# Patient Record
Sex: Male | Born: 1995 | Race: White | Hispanic: No | Marital: Single
Health system: Midwestern US, Community
[De-identification: ages and names within clinical notes are randomized; demographics above are authoritative.]

## PROBLEM LIST (undated history)

## (undated) DIAGNOSIS — F319 Bipolar disorder, unspecified: Secondary | ICD-10-CM

## (undated) DIAGNOSIS — F419 Anxiety disorder, unspecified: Secondary | ICD-10-CM

---

## 2015-09-28 ENCOUNTER — Encounter (HOSPITAL_BASED_OUTPATIENT_CLINIC_OR_DEPARTMENT_OTHER): Payer: Self-pay

## 2015-09-28 ENCOUNTER — Emergency Department (HOSPITAL_BASED_OUTPATIENT_CLINIC_OR_DEPARTMENT_OTHER)
Admission: EM | Admit: 2015-09-28 | Discharge: 2015-09-29 | Disposition: A | Discharge status: Other Acute Inpatient Hospital | Payer: Self-pay | Attending: Emergency Medicine | Admitting: Emergency Medicine

## 2015-09-28 DIAGNOSIS — F29 Unspecified psychosis not due to a substance or known physiological condition: Secondary | ICD-10-CM | POA: Insufficient documentation

## 2015-09-28 DIAGNOSIS — F1220 Cannabis dependence, uncomplicated: Secondary | ICD-10-CM | POA: Diagnosis present

## 2015-09-28 HISTORY — DX: Bipolar disorder, unspecified: F31.9

## 2015-09-28 HISTORY — DX: Anxiety disorder, unspecified: F41.9

## 2015-09-28 LAB — RAPID URINE DRUG SCREEN, HOSP PERFORMED
Amphetamines: NOT DETECTED
Barbiturates: NOT DETECTED
Benzodiazepines: NOT DETECTED
Cocaine: NOT DETECTED
Opiates: NOT DETECTED
Tetrahydrocannabinol: POSITIVE — AB

## 2015-09-28 LAB — CBC WITH DIFFERENTIAL/PLATELET
BASOS ABS: 0 10*3/uL (ref 0.0–0.1)
Basophils Relative: 0 %
EOS ABS: 0.2 10*3/uL (ref 0.0–0.7)
EOS PCT: 2 %
HCT: 46.6 % (ref 39.0–52.0)
Hemoglobin: 16.3 g/dL (ref 13.0–17.0)
LYMPHS ABS: 2.1 10*3/uL (ref 0.7–4.0)
LYMPHS PCT: 19 %
MCH: 30.5 pg (ref 26.0–34.0)
MCHC: 35 g/dL (ref 30.0–36.0)
MCV: 87.3 fL (ref 78.0–100.0)
MONO ABS: 0.9 10*3/uL (ref 0.1–1.0)
Monocytes Relative: 8 %
Neutro Abs: 7.8 10*3/uL — ABNORMAL HIGH (ref 1.7–7.7)
Neutrophils Relative %: 71 %
PLATELETS: 273 10*3/uL (ref 150–400)
RBC: 5.34 MIL/uL (ref 4.22–5.81)
RDW: 12.8 % (ref 11.5–15.5)
WBC: 11 10*3/uL — AB (ref 4.0–10.5)

## 2015-09-28 LAB — ACETAMINOPHEN LEVEL

## 2015-09-28 LAB — COMPREHENSIVE METABOLIC PANEL
ALT: 76 U/L — AB (ref 17–63)
AST: 41 U/L (ref 15–41)
Albumin: 4.6 g/dL (ref 3.5–5.0)
Alkaline Phosphatase: 68 U/L (ref 38–126)
Anion gap: 9 (ref 5–15)
BUN: 12 mg/dL (ref 6–20)
CHLORIDE: 104 mmol/L (ref 101–111)
CO2: 24 mmol/L (ref 22–32)
Calcium: 9.1 mg/dL (ref 8.9–10.3)
Creatinine, Ser: 0.8 mg/dL (ref 0.61–1.24)
Glucose, Bld: 91 mg/dL (ref 65–99)
POTASSIUM: 3.8 mmol/L (ref 3.5–5.1)
SODIUM: 137 mmol/L (ref 135–145)
Total Bilirubin: 0.5 mg/dL (ref 0.3–1.2)
Total Protein: 7.9 g/dL (ref 6.5–8.1)

## 2015-09-28 LAB — URINALYSIS, ROUTINE W REFLEX MICROSCOPIC
Bilirubin Urine: NEGATIVE
GLUCOSE, UA: NEGATIVE mg/dL
Hgb urine dipstick: NEGATIVE
KETONES UR: NEGATIVE mg/dL
LEUKOCYTES UA: NEGATIVE
NITRITE: NEGATIVE
PH: 7 (ref 5.0–8.0)
Protein, ur: NEGATIVE mg/dL
Specific Gravity, Urine: 1.024 (ref 1.005–1.030)

## 2015-09-28 LAB — SALICYLATE LEVEL

## 2015-09-28 LAB — ETHANOL

## 2015-09-28 MED ORDER — LORAZEPAM 1 MG PO TABS
1.0000 mg | ORAL_TABLET | Freq: Three times a day (TID) | ORAL | Status: DC | PRN
  Administered 2015-09-29 (×2): 1 mg via ORAL
  Filled 2015-09-28: qty 1

## 2015-09-28 MED ORDER — ZIPRASIDONE MESYLATE 20 MG IM SOLR
20.0000 mg | Freq: Once | INTRAMUSCULAR | Status: AC | PRN
  Administered 2015-09-28: 20 mg via INTRAMUSCULAR
  Filled 2015-09-28: qty 20

## 2015-09-28 MED ORDER — LORAZEPAM 1 MG PO TABS
2.0000 mg | ORAL_TABLET | Freq: Once | ORAL | Status: AC
  Administered 2015-09-28: 2 mg via ORAL

## 2015-09-28 MED ORDER — ONDANSETRON HCL 4 MG PO TABS: 4.0000 mg | ORAL_TABLET | Freq: Three times a day (TID) | ORAL | Status: DC | PRN

## 2015-09-28 MED ORDER — LORAZEPAM 1 MG PO TABS
1.0000 mg | ORAL_TABLET | Freq: Once | ORAL | Status: DC
  Filled 2015-09-28: qty 1

## 2015-09-28 MED ORDER — DIPHENHYDRAMINE HCL 50 MG/ML IJ SOLN
50.0000 mg | Freq: Once | INTRAMUSCULAR | Status: AC
  Administered 2015-09-28: 50 mg via INTRAMUSCULAR
  Filled 2015-09-28: qty 1

## 2015-09-28 MED ORDER — ZIPRASIDONE MESYLATE 20 MG IM SOLR: 20.0000 mg | Freq: Four times a day (QID) | INTRAMUSCULAR | Status: DC | PRN

## 2015-09-28 MED ORDER — LORAZEPAM 2 MG/ML IJ SOLN
2.0000 mg | Freq: Once | INTRAMUSCULAR | Status: AC
  Administered 2015-09-28: 2 mg via INTRAMUSCULAR
  Filled 2015-09-28: qty 1

## 2015-09-28 MED ORDER — STERILE WATER FOR INJECTION IJ SOLN
INTRAMUSCULAR | Status: AC
  Administered 2015-09-28: 1.2 mL
  Filled 2015-09-28: qty 10

## 2015-09-28 MED ORDER — IBUPROFEN 200 MG PO TABS: 600.0000 mg | ORAL_TABLET | Freq: Three times a day (TID) | ORAL | Status: DC | PRN

## 2015-09-28 MED ORDER — LORAZEPAM 1 MG PO TABS
ORAL_TABLET | ORAL | Status: AC
  Filled 2015-09-28: qty 2

## 2015-09-28 MED ORDER — ACETAMINOPHEN 325 MG PO TABS: 650.0000 mg | ORAL_TABLET | ORAL | Status: DC | PRN

## 2015-09-28 NOTE — ED Notes (Signed)
Mother in waiting room report pt is sending messages thru facebook to her , HPPD made aware and cell phone taken and given to mother

## 2015-09-28 NOTE — ED Notes (Signed)
Pa  at bedside. 

## 2015-09-28 NOTE — ED Notes (Addendum)
Writer explain to patient that Anthony Sievert, PA had order medication to help decrease his agitation and anxiety. Patient yelled "Trump says I don't have to take that shit". "I don't take government medication but if you give me weed or marijuana I'll take that".  Patient given injection with moderate resistance. Encouragement and support provided and  Safety maintain. Q 15 min safety checks remain in place.

## 2015-09-28 NOTE — ED Notes (Addendum)
Pt states "i can read anybody and i just know things but they think i'm crazy"-pt with fast talking rambling speech-states mother brought him in due to argument with mother and dad-pt was brought in by mother who is in ED WR

## 2015-09-28 NOTE — ED Notes (Signed)
HPPD and security at bedside , IVC papers completed

## 2015-09-28 NOTE — ED Notes (Signed)
Patient laying down on stretcher. Respirations equal and unlabored, skin warm and dry. NAD noted. Encouragement and support provided and safety maintain. Q 15 min safety checks remain in place.

## 2015-09-28 NOTE — ED Notes (Signed)
Donell Sievert, PA contacted and informed of patient behavior and new orders received Ativan 2 mg IM and benadryl 50 mg IM orders read back and verified. Q 15 min safety checks remain in place.

## 2015-09-28 NOTE — ED Notes (Signed)
Writer went in to introduce self to patient. Patient started stating "I'm not staying her nigger bitch". Writer tried to explain discharge procedure to patient. He stated " I'm kicked this damn door down to get out". "You can't hold me ". Writer once again tried to explain to patient about involuntary procedure and patient states "fuck that I ain't sign shit". "You holding me here illegally". Patient agitation increased. Patient pacing yelling, and punching walls. Encouragement and support provided and safety maintain. Q 15 min safety checks in place.
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
## 2015-09-28 NOTE — Progress Notes (Signed)
Referral has been sent to: [facility] - per Dondra Spry, send referral. [facility] - per Arlys John, fax over referral. [facility] - per intake, fax referral for the waitlist.  [facility] - referral can be faxed with IVC papers. [facility] - per [place], will look at referrals for tomorrow.  At capacity: [facility] - per Jeanie Cooks - per Charlott Rakes - per Olga Millers, but call back Darlene at 8:30am   CSW will continue to seek placement.  Melbourne Abts, LCSWA Disposition staff 09/28/2015 6:08 PM

## 2015-09-28 NOTE — ED Provider Notes (Signed)
MSE was initiated and I personally evaluated the patient and placed orders (if any) at  9:09 PM on September 28, 2015.  Plan she'll male with psychosis accepted in transfer from [facility]. Appears stable for psychiatric placement.  The patient appears stable so that the remainder of the MSE may be completed by another provider.   Lyndal Pulley, MD 09/28/15 2109

## 2015-09-28 NOTE — ED Notes (Signed)
TTS eval with pt now
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
## 2015-09-28 NOTE — ED Notes (Signed)
Bed: WUJ81 Expected date: 09/28/15 Expected time:  Means of arrival:  Comments: Holding for [facility] pt

## 2015-09-28 NOTE — ED Notes (Signed)
Patient ambulatory with a steady gait to treatment area 42 escorted by 2 [employer] officers and [place], [place].

## 2015-09-28 NOTE — ED Notes (Signed)
IVC papers faxed to [facility]

## 2015-09-28 NOTE — ED Notes (Signed)
MD at bedside. 

## 2015-09-28 NOTE — ED Notes (Signed)
Johna Roles RN [facility] charge nurse accepted Pt [facility] ED , HPPD made aware. Mother notified. Report called to [facility] RN [phone]

## 2015-09-28 NOTE — ED Notes (Addendum)
Tyler Aas ( mother) phone number [phone]

## 2015-09-29 ENCOUNTER — Inpatient Hospital Stay
Admission: EM | Admit: 2015-09-29 | Discharge: 2015-10-04 | DRG: 885 | Disposition: A | Discharge status: Home / Self Care | Payer: No Typology Code available for payment source | Source: Intra-hospital | Attending: Psychiatry | Admitting: Psychiatry

## 2015-09-29 ENCOUNTER — Encounter: Payer: Self-pay | Admitting: Psychiatry

## 2015-09-29 DIAGNOSIS — F4010 Social phobia, unspecified: Secondary | ICD-10-CM | POA: Diagnosis present

## 2015-09-29 DIAGNOSIS — F17200 Nicotine dependence, unspecified, uncomplicated: Secondary | ICD-10-CM | POA: Diagnosis present

## 2015-09-29 DIAGNOSIS — F312 Bipolar disorder, current episode manic severe with psychotic features: Secondary | ICD-10-CM

## 2015-09-29 DIAGNOSIS — E8881 Metabolic syndrome: Secondary | ICD-10-CM | POA: Diagnosis present

## 2015-09-29 DIAGNOSIS — F410 Panic disorder [episodic paroxysmal anxiety] without agoraphobia: Secondary | ICD-10-CM | POA: Diagnosis present

## 2015-09-29 DIAGNOSIS — F1220 Cannabis dependence, uncomplicated: Secondary | ICD-10-CM | POA: Diagnosis present

## 2015-09-29 DIAGNOSIS — R45850 Homicidal ideations: Secondary | ICD-10-CM | POA: Diagnosis present

## 2015-09-29 DIAGNOSIS — F29 Unspecified psychosis not due to a substance or known physiological condition: Secondary | ICD-10-CM | POA: Insufficient documentation

## 2015-09-29 DIAGNOSIS — G4700 Insomnia, unspecified: Secondary | ICD-10-CM | POA: Diagnosis present

## 2015-09-29 DIAGNOSIS — Z818 Family history of other mental and behavioral disorders: Secondary | ICD-10-CM | POA: Diagnosis not present

## 2015-09-29 DIAGNOSIS — F429 Obsessive-compulsive disorder, unspecified: Secondary | ICD-10-CM | POA: Diagnosis present

## 2015-09-29 MED ORDER — NICOTINE 21 MG/24HR TD PT24
21.0000 mg | MEDICATED_PATCH | Freq: Once | TRANSDERMAL | Status: DC
  Administered 2015-09-29: 21 mg via TRANSDERMAL
  Filled 2015-09-29: qty 1

## 2015-09-29 MED ORDER — MAGNESIUM HYDROXIDE 400 MG/5ML PO SUSP: 30.0000 mL | Freq: Every day | ORAL | Status: DC | PRN

## 2015-09-29 MED ORDER — ALUM & MAG HYDROXIDE-SIMETH 200-200-20 MG/5ML PO SUSP
30.0000 mL | ORAL | Status: DC | PRN
  Administered 2015-09-30: 30 mL via ORAL
  Filled 2015-09-29: qty 30

## 2015-09-29 MED ORDER — OLANZAPINE 5 MG PO TBDP
15.0000 mg | ORAL_TABLET | Freq: Every day | ORAL | Status: DC
  Administered 2015-09-29 – 2015-10-03 (×5): 15 mg via ORAL
  Filled 2015-09-29 (×6): qty 3

## 2015-09-29 MED ORDER — OXCARBAZEPINE 300 MG PO TABS
300.0000 mg | ORAL_TABLET | Freq: Two times a day (BID) | ORAL | Status: DC
  Administered 2015-09-29: 300 mg via ORAL
  Filled 2015-09-29: qty 1

## 2015-09-29 MED ORDER — ACETAMINOPHEN 325 MG PO TABS
650.0000 mg | ORAL_TABLET | Freq: Four times a day (QID) | ORAL | Status: DC | PRN
  Administered 2015-10-02 – 2015-10-04 (×2): 650 mg via ORAL
  Filled 2015-09-29 (×2): qty 2

## 2015-09-29 MED ORDER — LORAZEPAM 2 MG PO TABS
2.0000 mg | ORAL_TABLET | ORAL | Status: DC | PRN
  Administered 2015-09-30 – 2015-10-01 (×3): 2 mg via ORAL
  Filled 2015-09-29 (×3): qty 1

## 2015-09-29 MED ORDER — TRAZODONE HCL 50 MG PO TABS
150.0000 mg | ORAL_TABLET | Freq: Every evening | ORAL | Status: DC | PRN
  Administered 2015-10-01 – 2015-10-03 (×3): 150 mg via ORAL
  Filled 2015-09-29 (×3): qty 1

## 2015-09-29 MED ORDER — OXCARBAZEPINE 300 MG PO TABS
300.0000 mg | ORAL_TABLET | Freq: Two times a day (BID) | ORAL | Status: DC
  Administered 2015-09-29 – 2015-09-30 (×2): 300 mg via ORAL
  Filled 2015-09-29 (×4): qty 1

## 2015-09-29 MED ORDER — HALOPERIDOL 1 MG PO TABS
0.5000 mg | ORAL_TABLET | Freq: Two times a day (BID) | ORAL | Status: DC
  Administered 2015-09-29: 0.5 mg via ORAL
  Filled 2015-09-29: qty 1

## 2015-09-29 MED ORDER — NICOTINE 21 MG/24HR TD PT24
21.0000 mg | MEDICATED_PATCH | Freq: Every day | TRANSDERMAL | Status: DC
  Administered 2015-09-30 – 2015-10-04 (×5): 21 mg via TRANSDERMAL
  Filled 2015-09-29 (×5): qty 1

## 2015-09-29 NOTE — BH Assessment (Signed)
Patient has been accepted to [facility].  Accepting physician is Dr. Jennet Maduro.  Attending Physician will be Dr. Jennet Maduro.  Patient has been assigned to room 322-A, by [facility] Charge Nurse Victorino Dike.  Call report to [phone].  Representative/Transfer Coordinator is Cindy Brindisi.  [facility] ER Staff Jessie Foot, TTS) made aware of acceptance.
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
## 2015-09-29 NOTE — ED Notes (Signed)
Patient slept soundly during shift. Respirations equal and unlabored, skin warm and dry, and NAD. Safety maintain and Q 15 min safety checks remain in place.

## 2015-09-29 NOTE — ED Notes (Signed)
Patient transported to [facility], by [place] [facility] [employer], for continuation of specialized care. Belongings signed for and given to deputy. Pt left in no acute distress. 

## 2015-09-29 NOTE — ED Notes (Addendum)
Pt has no insight in regards to his behavior. Pt blaming others for being in the hospital. Pt denies SI/HI. Delusional. 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 Pt presents with paranoia about government. Remains on special checks q 15 mins for safety. Will continue to monitor.

## 2015-09-29 NOTE — ED Notes (Signed)
Patient calm and cooperative at this time. Encouragement and support provided and safety maintain. Q 15 min safety checks in place.

## 2015-09-29 NOTE — ED Notes (Signed)
Report called to Victorino Dike, Charity fundraiser at [facility]. Sheriff called for transport. Will continue to monitor.

## 2015-09-30 DIAGNOSIS — F312 Bipolar disorder, current episode manic severe with psychotic features: Principal | ICD-10-CM

## 2015-09-30 LAB — LIPID PANEL
Cholesterol: 114 mg/dL (ref 0–200)
HDL: 37 mg/dL — ABNORMAL LOW (ref >40)
LDL CALC: 66 mg/dL (ref 0–99)
Total CHOL/HDL Ratio: 3.1 RATIO
Triglycerides: 53 mg/dL (ref <150)
VLDL: 11 mg/dL (ref 0–40)

## 2015-09-30 LAB — TSH: TSH: 2.339 u[IU]/mL (ref 0.350–4.500)

## 2015-09-30 LAB — HEMOGLOBIN A1C: HEMOGLOBIN A1C: 5 % (ref 4.0–6.0)
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
## 2015-09-30 NOTE — [facility] Group Notes (Signed)
[facility] LCSW Group Therapy  09/30/2015 3:15 PM  Type of Therapy:  Group Therapy  Participation Level:  Did Not Attend  Modes of Intervention:  Discussion, Education, Socialization and Support  Summary of Progress/Problems: Balance in life: Patients will discuss the concept of balance and how it looks and feels to be unbalanced. Pt will identify areas in their life that is unbalanced and ways to become more balanced.    Tenasia Aull L Jemimah Cressy MSW, LCSWA  09/30/2015, 3:15 PM   
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
## 2015-09-30 NOTE — Tx Team (Signed)
Initial Interdisciplinary Treatment Plan   PATIENT STRESSORS: Marital or family conflict Substance abuse   PATIENT STRENGTHS: Ability for insight Active sense of humor Communication skills Motivation for treatment/growth   PROBLEM LIST: Problem List/Patient Goals Date to be addressed Date deferred Reason deferred Estimated date of resolution  Anger & Aggression 09/29/2015     Homicidal Ideation 09/29/2015     Substance Abuse --- MJ 09/29/2015     Psychosis/Delusion 09/29/2015                                    DISCHARGE CRITERIA:  Ability to meet basic life and health needs Motivation to continue treatment in a less acute level of care Reduction of life-threatening or endangering symptoms to within safe limits Safe-care adequate arrangements made Verbal commitment to aftercare and medication compliance  PRELIMINARY DISCHARGE PLAN: Attend aftercare/continuing care group Participate in family therapy Return to previous living arrangement  PATIENT/FAMIILY INVOLVEMENT: This treatment plan has been presented to and reviewed with the patient, Anthony Koch, and/or family member  The patient and family have been given the opportunity to ask questions and make suggestions.  Inetta Dicke T Robyn Galati 09/30/2015, 5:20 AM

## 2015-09-30 NOTE — Progress Notes (Signed)
Arrived to unit @ 2100 in the custody of Law Enforcement accompanied by hospital nursing staffs, Anthony Koch is a 20yo WM admitted from [facility] for Psychosis, Delusions, h/o Bipolar d/o, manic, IVC by parents. Patient searched by 2 nursing staffs on admission for contrabands to ensure a safe and therapeutic milieu for all, no paraphilia or any other contrabands found on patient or in the belongings. Skin Assessment completed and documented, patient's back significantly observed with "red angry looking rashes, they are acnes, I got them from [place] when I was incarcerated for drug trafficking, I don't plan to kill myself because I have a 79 year old son .Marland Kitchen.."  Bright, approachable, mood and affect upbeat, readily adjusted to the new environment.

## 2015-09-30 NOTE — BHH Group Notes (Signed)
BHH Group Notes:  (Nursing/MHT/Case Management/Adjunct)  Date:  09/30/2015  Time:  3:18 PM  Type of Therapy:  Psychoeducational Skills  Participation Level:  Did Not Attend  Anthony Koch 09/30/2015, 3:18 PM 

## 2015-09-30 NOTE — Progress Notes (Signed)
Recreation Therapy Notes  INPATIENT RECREATION THERAPY ASSESSMENT  Patient Details Name: Anthony Koch MRN: [id number] DOB: [birth year] Today's Date: 09/30/2015  Patient Stressors: Family, Relationship, Other (Comment) (Constant nagging from father and brother, feels like mother loves other kids more than pt; dad has lung cancer and COPD, mom has MS and heart problems; the way his family has become is stressful; recent break-up with girlfriend of 4 years, about to be )engaged; son lives with the mom in [place] - pt cannot see son until he can pass a drug test.  Coping Skills:   Isolate, Substance Abuse, Avoidance, Exercise, Music, Sports, Other (Comment) (Cry)  Personal Challenges: Anger, Communication, Concentration, Expressing Yourself, Relationships, Self-Esteem/Confidence, Social Interaction, Stress Management, Time Management, Trusting Others  Leisure Interests (2+):  Individual - Other (Comment) (Music and cars)  Awareness of Community Resources:  No  Community Resources:     Current Use:    If no, Barriers?:    Patient Strengths:  [employer], intelligent  Patient Identified Areas of Improvement:  Anxiety, anger  Current Recreation Participation:  Playing video games, selling drugs  Patient Goal for Hospitalization:  To get out of here  [place] of Residence:  [place] of Residence:  [place]   Current [place] (including self-harm):  No  Current HI:  No  Consent to Intern Participation: N/A   Jacquelynn Cree, LRT/CTRS 09/30/2015, 11:28 AM

## 2015-09-30 NOTE — Plan of Care (Signed)
Problem: Alteration in mood & ability to function due to Goal: LTG-Pt reports reduction in suicidal thoughts (Patient reports reduction in suicidal thoughts and is able to verbalize a safety plan for whenever patient is feeling suicidal)  Outcome: Progressing Denies SI     

## 2015-09-30 NOTE — BHH Group Notes (Signed)
BHH Group Notes:  (Nursing/MHT/Case Management/Adjunct)  Date:  09/30/2015  Time:  8:57 AM  Type of Therapy:  Community Meeting   Participation Level:  Did Not Attend:  Pier Bosher De'Chelle Shontavia Mickel 09/30/2015, 8:57 AM

## 2015-09-30 NOTE — Progress Notes (Signed)
Recreation Therapy Notes  Date: 01.26.17 Time: 3:00 pm Location: Craft Room  Group Topic: Leisure Education  Goal Area(s) Addresses:  Patient will identify activities for each letter of the alphabet. Patient will verbalize ability to integrate positive leisure into life post d/c. Patient will verbalize ability to use leisure as a Associate Professor.  Behavioral Response: Did not attend  Intervention: Leisure Alphabet  Activity: Patients were given a Leisure Information systems manager and instructed to list healthy leisure activities for each letter of the alphabet bet.  Education: LRT educated patients on what they need to participate in leisure.  Education Outcome: Patient did not attend group.  Clinical Observations/Feedback: Patient did not attend group.  Jacquelynn Cree, LRT/CTRS 09/30/2015 4:21 PM

## 2015-10-01 LAB — PROLACTIN: Prolactin: 20 ng/mL — ABNORMAL HIGH (ref 4.0–15.2)

## 2015-10-01 MED ORDER — CLONIDINE HCL 0.1 MG PO TABS
ORAL_TABLET | ORAL | Status: AC
  Administered 2015-10-01: 11:00:00
  Filled 2015-10-01: qty 2

## 2015-10-01 MED ORDER — FLUOXETINE HCL 20 MG PO CAPS
20.0000 mg | ORAL_CAPSULE | Freq: Every day | ORAL | Status: DC
  Administered 2015-10-01 – 2015-10-04 (×4): 20 mg via ORAL
  Filled 2015-10-01 (×4): qty 1

## 2015-10-01 MED ORDER — CLONIDINE HCL 0.1 MG PO TABS
0.2000 mg | ORAL_TABLET | ORAL | Status: AC
  Administered 2015-10-01: 0.2 mg via ORAL
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
## 2015-10-01 NOTE — Progress Notes (Signed)
D: Pt denies SI/HI/AVH. Pt is pleasant and cooperative. Pt stated he feels tired and he thinks it might be some side effect of medication, VS WNL no acute distress noted. Patient  appears anxious. Patient isolates to his room not interacting with peers.  A: Pt was offered support and encouragement.   Pt was encouraged to speak with the MD during the morning check up. Pt was given scheduled medications. Pt was encouraged to attend groups. Q 15 minute checks were done for safety.  R:Pt attends groups and interacts well with peers and staff. Pt is taking medication. Pt receptive to treatment and safety maintained on unit.
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
## 2015-10-01 NOTE — Plan of Care (Signed)
Problem: [facility] Participation in Recreation Therapeutic Interventions Goal: STG-Patient will identify at least five coping skills for ** STG: Coping Skills - Within 4 treatment sessions, patient will verbalize at least 5 coping skills for anger in each of 2 treatment sessions to increase anger management skills post d/c.  Outcome: Progressing Treatment Session 1; Completed 1 out of 2: At approximately 2:20 pm, LRT met with patient in consultation room. Patient verbalized 5 coping skills for anger. Patient verbalized what triggers him to get angry, how his body responds to anger, and how he is going to remember to use his healthy coping skills. LRT provided suggestions as well. Intervention Used: Coping Skills worksheet  Leonette Monarch, LRT/CTRS 01.27.17 2:38 pm Goal: STG-Other Recreation Therapy Goal (Specify) STG: Stress Management - Within 4 treatment sessions, patient will verbalize understanding of the stress management techniques in each of 2 treatment sessions to increase stress management skills post d/c.  Outcome: Progressing Treatment Session 1; Completed 1 out of 2: At approximately 2:20 pm, LRT met with patient in consultation room. LRT educated and provided patient with handouts on stress management techniques. Patient verbalized understanding. LRT encouraged patient to read over and practice the stress management techniques. Intervention Used: Stress Management handouts  Leonette Monarch, LRT/CTRS 01.27.17 2:40 pm
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
## 2015-10-01 NOTE — Progress Notes (Signed)
Recreation Therapy Notes  Date: 01.27.17 Time: 1:00 pm Location: Craft Room  Group Topic: Communication, Problem solving, and Teamwork  Goal Area(s) Addresses:  Patient will work in teams towards shared goal. Patient will verbalize skills needed to make activity successful. Patient will verbalize benefit of using skills identified to reach post d/c. goals  Behavioral Response: Did not attend   Intervention: Landing Pad  Activity: Patients were given 15 straws and approximately 3 feet of tape to build a contraption to catch a golf ball that was dropped from about 4 feet.  Education: LRT educated patients on how to use these skills post d/c.  Education Outcome: Patient did not attend group.  Clinical Observations/Feedback: Patient did not attend group.  Jacquelynn Cree, LRT/CTRS 10/01/2015 3:10 PM

## 2015-10-01 NOTE — [facility] Suicide Risk Assessment (Signed)
[facility] INPATIENT:  Family/Significant Other Suicide Prevention Education  Suicide Prevention Education:  Patient Refusal for Family/Significant Other Suicide Prevention Education: The patient Anthony Koch has refused to provide written consent for family/significant other to be provided Family/Significant Other Suicide Prevention Education during admission and/or prior to discharge.  Physician notified.  Pt refused SPE from the CSW.  Dorothe Pea Birgit Nowling 10/01/2015, 3:17 PM

## 2015-10-01 NOTE — Progress Notes (Signed)
Patient presents to nurses station complaining of intense pain to RUQ of abdomen right below rib cage.  Tender to touch.  BP 142/125.  Pulse 92.  Dr. Jennet Maduro paged and returned call.  Orders received.

## 2015-10-01 NOTE — Progress Notes (Signed)
CSW made attempt to contact the pt's mother Anthony Koch at ph: [phone], but was unable to speak to her and the CSW left a phone message.

## 2015-10-01 NOTE — [facility] Group Notes (Signed)
[facility] LCSW Group Therapy  10/01/2015 1:57 PM  Type of Therapy:  Group Therapy  Participation Level:  Did Not Attend  Modes of Intervention:  Discussion, Education, Socialization and Support  Summary of Progress/Problems: Feelings around Relapse. Group members discussed the meaning of relapse and shared personal stories of relapse, how it affected them and others, and how they perceived themselves during this time. Group members were encouraged to identify triggers, warning signs and coping skills used when facing the possibility of relapse. Social supports were discussed and explored in detail.   [employer] MSW, LCSWA  10/01/2015, 1:57 PM

## 2015-10-01 NOTE — Plan of Care (Signed)
Problem: Alteration in thought process Goal: LTG-Patient behavior demonstrates decreased signs psychosis (Patient behavior demonstrates decreased signs of psychosis to the point the patient is safe to return home and continue treatment in an outpatient setting.)  Outcome: Progressing Decreased psychosis noted.      

## 2015-10-02 NOTE — Progress Notes (Signed)
D: Pt denies SI/HI/AVH. Pt is pleasant and cooperative, affect is flat and sad. Patient appears less anxious and he is interacting with peers and staff appropriately.  A: Pt was offered support and encouragement. Pt was given scheduled medications. Pt was encouraged to attend groups. Q 15 minute checks were done for safety.  R:Pt attends groups and interacts well with peers and staff. Pt is taking medication. Pt receptive to treatment and safety maintained on unit.
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
## 2015-10-02 NOTE — [facility] Group Notes (Signed)
[facility] Group Notes:  (Nursing/MHT/Case Management/Adjunct)  Date:  10/02/2015  Time:  12:15 PM  Type of Therapy:  Group Therapy  Participation Level:  Did Not Attend   Buel Molder De'Chelle Braelynn Benning 10/02/2015, 12:15 PM

## 2015-10-02 NOTE — Progress Notes (Signed)
D:  Patient is alert and oriented on the unit this shift.  Patient attended and actively participated in groups in the afternoon.  Patient denies suicidal ideation, homicidal ideation, auditory or visual hallucinations at the present time.   A:  Scheduled medications are administered to patient as per MD orders.  Emotional support and encouragement are provided.  Patient is maintained on q.15 minute safety checks.  Patient is informed to notify staff with questions or concerns. R:  No adverse medication reactions are noted.  Patient is cooperative with medication administration and treatment plan today.  Patient slept most of the morning.  Patient is receptive, calm and cooperative on the unit at this time.  Patient interacts well with others on the unit this shift.  Patient contracts for safety at this time.  Patient remains safe at this time.

## 2015-10-02 NOTE — Plan of Care (Signed)
Problem: Alteration in thought process Goal: LTG-Patient is able to perceive the environment accurately Outcome: Progressing Patient appears alert and oriented this shift
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
## 2015-10-02 NOTE — Plan of Care (Signed)
Problem: Alteration in thought process Goal: LTG-Patient behavior demonstrates decreased signs psychosis (Patient behavior demonstrates decreased signs of psychosis to the point the patient is safe to return home and continue treatment in an outpatient setting.)  Outcome: Progressing Patient denies SI/HI.      

## 2015-10-02 NOTE — Plan of Care (Signed)
Problem: Alteration in thought process Goal: STG-Patient is able to follow short directions Outcome: Progressing Patient is cooperative and following short directions this shift

## 2015-10-03 NOTE — Progress Notes (Signed)
Patient denies SI/HI/AVH. Patient states that he is here because he has issues with anger and his parents made him come. Patient states that he is "not crazy" and that being around "all these crazy people is going to make me crazy". Patient compliant with medications. States that the medication that he took the previous night "the dissolving one"  made him "cry and become emotional". Patient is hyper and fidgety. The patient states that he is always moving and feeling anxious. Q 15 min checks maintained.

## 2015-10-03 NOTE — Progress Notes (Signed)
Patient remains anxious, back and forth pacing hallways to his room and dayroom through out the evening. He came to medication room and was med compliant. He voiced that he missed his two year old child. He appears to be resting in bed quietly at this time.

## 2015-10-03 NOTE — Progress Notes (Signed)
D: Patient bright on approach. States he's here because he had some anger problems but doesn't need to be here anymore. Denies SI/HI/AVH. Denies pain. Visible in the milieu and attending groups.  A: No medication given. Encouragement provided.  R: Patient calm and cooperative. Safety maintained with 15 min checks.

## 2015-10-03 NOTE — [facility] Group Notes (Signed)
[facility] LCSW Group Therapy  10/03/2015 4:09 PM  Type of Therapy:  Group Therapy  Participation Level:  Active  Participation Quality:  Attentive  Affect:  Euphoric  Cognitive:  Lacking  Insight:  Limited  Engagement in Therapy:  Limited  Modes of Intervention:  Discussion, Education, Socialization and Support  Summary of Progress/Problems: Mindfulness: Patient discussed mindfulness and relaxing techniques and why they are beneficial. Pt discussed ways to incorporate mindfulness in their lives. Pt practiced a mindfulness techique and discussed how it made them feel. Anthony Koch attended group and stayed the entire time. He states all he needs to be relaxed is a hot shower and "some booty".   [employer] MSW, LCSWA  10/03/2015, 4:09 PM
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
## 2015-10-03 NOTE — Plan of Care (Signed)
Problem: Ineffective individual coping Goal: STG: Pt will be able to identify effective and ineffective STG: Pt will be able to identify effective and ineffective coping patterns  Outcome: Progressing Patient was anxious on shift and needed PRN medication to help him calm down, but he was able to recognize his anxiety and prevent other behavioral issues.

## 2015-10-04 MED ORDER — FLUOXETINE HCL 20 MG PO CAPS: 20.0000 mg | ORAL_CAPSULE | Freq: Every day | ORAL | Status: DC

## 2015-10-04 MED ORDER — TRAZODONE HCL 150 MG PO TABS: 150.0000 mg | ORAL_TABLET | Freq: Every evening | ORAL | Status: DC | PRN

## 2015-10-04 MED ORDER — OLANZAPINE 15 MG PO TBDP: 15.0000 mg | ORAL_TABLET | Freq: Every day | ORAL | Status: DC

## 2015-10-04 NOTE — Tx Team (Signed)
Interdisciplinary Treatment Plan Update (Adult)  Date:  10/04/2015 Time Reviewed:  12:55 PM  Progress in Treatment: Attending groups: Yes. Participating in groups:  Yes. Taking medication as prescribed:  Yes. Tolerating medication:  Yes. Family/Significant othe contact made:  Yes, individual(s) contacted:  patient's mother Patient understands diagnosis:  Yes. Discussing patient identified problems/goals with staff:  Yes. Medical problems stabilized or resolved:  Yes. Denies suicidal/homicidal ideation: Yes. Issues/concerns per patient self-inventory:  Yes. Other:  New problem(s) identified: No, Describe:  none reported  Discharge Plan or Barriers: Patient will stabilize on medication and discharge home with family. Patient will need outpatient mental health follow up in [facility] at discharge.   Reason for Continuation of Hospitalization: Mania Medication stabilization  Comments:  Estimated length of stay: 0 days will discharge today Monday 10/04/15  New goal(s):  Review of initial/current patient goals per problem list:   1.  Goal(s):participate in aftercare plan  Met:  Yes  Target date:at discharge  As evidenced [id number] will participate in aftercare plan AEB aftercare provider and housing plan identified at discharge  10/04/15: patient has aftercare provider and will discharge home with his mother 2.  Goal (s): decrease mania  Met:  Yes  Target date:at discharge  As evidenced by: patient demonstrates decreased symptoms of mania 10/04/15: patient symptoms of mania have decreased and has shown much improvement and stable for discharge per MD  Attendees: Physician:  Orson Slick, MD 1/30/201712:55 PM  Nursing:   Nicanor Bake, RN 1/30/201712:55 PM  Other:  Carmell Austria, [place] 1/30/201712:55 PM  Other:   1/30/201712:55 PM  Other:   1/30/201712:55 PM  Other:  1/30/201712:55 PM  Other:  1/30/201712:55 PM  Other:  1/30/201712:55 PM  Other:   1/30/201712:55 PM  Other:  1/30/201712:55 PM  Other:  1/30/201712:55 PM  Other:   1/30/201712:55 PM   Scribe for Treatment Team:   Keene Breath, MSW, [place] [phone]  10/04/2015, 12:55 PM

## 2015-10-04 NOTE — Plan of Care (Signed)
Problem: Aggression Towards others,Towards Self, and or Destruction Goal: STG-Patient will comply with prescribed medication regimen (Patient will comply with prescribed medication regimen)  Outcome: Progressing Patient is cooperative with medication administration this shift

## 2015-10-04 NOTE — [facility] Suicide Risk Assessment (Signed)
[facility] Discharge Suicide Risk Assessment   Principal Problem: Bipolar I disorder, most recent episode manic, severe with psychotic features [facility]) Discharge Diagnoses:  Patient Active Problem List   Diagnosis Date Noted  . Cannabis use disorder, severe, dependence (HCC) [F12.20] 09/29/2015  . Tobacco use disorder [F17.200] 09/29/2015  . Bipolar I disorder, most recent episode manic, severe with psychotic features (HCC) [F31.2] 09/29/2015  . Psychoses [F29]     Total Time spent with patient: 30 minutes  Musculoskeletal: Strength & Muscle Tone: within normal limits Gait & Station: normal Patient leans: N/A  Psychiatric Specialty Exam: Review of Systems  All other systems reviewed and are negative.   Blood pressure 113/65, pulse 46, temperature 98.6 F (37 C), temperature source Oral, resp. rate 20, height  (1.778 m), weight 81.647 kg (180 lb), SpO2 99 %.Body mass index is 25.83 kg/(m^2).  General Appearance: Casual  Eye Contact::  Good  Speech:  Clear and Coherent409  Volume:  Normal  Mood:  Euthymic  Affect:  Appropriate  Thought Process:  Goal Directed  Orientation:  Full (Time, Place, and Person)  Thought Content:  WDL  Suicidal Thoughts:  No  Homicidal Thoughts:  No  Memory:  Immediate;   Fair Recent;   Fair Remote;   Fair  Judgement:  Fair  Insight:  Fair  Psychomotor Activity:  Normal  Concentration:  Fair  Recall:  [employer] of Knowledge:Fair  Language: Fair  Akathisia:  No  Handed:  Left  AIMS (if indicated):     Assets:  Communication Skills Desire for Improvement Housing Physical Health Resilience Social Support  Sleep:  Number of Hours: 8.25  Cognition: WNL  ADL's:  Intact   Mental Status Per Nursing Assessment::   On Admission:     Demographic Factors:  Male, Adolescent or young adult, Caucasian and Unemployed  Loss Factors: Financial problems/change in socioeconomic status  Historical Factors: Impulsivity  Risk Reduction Factors:    Sense of responsibility to family, Living with another person, especially a relative and Positive social support  Continued Clinical Symptoms:  Bipolar Disorder:   Mixed State Alcohol/Substance Abuse/Dependencies  Cognitive Features That Contribute To Risk:  None    Suicide Risk:  Minimal: No identifiable suicidal ideation.  Patients presenting with no risk factors but with morbid ruminations; may be classified as minimal risk based on the severity of the depressive symptoms  Follow-up Information    Follow up with [facility].   Why:  Please arrive to the walk-in clinic between the hours of 9am-4pm for your re-entry assessment for medication managment, therapy and substance abuse treatment   Contact information:   [address]  [place], [place] [address] Phone: [phone] Fax: [phone]      Plan Of Care/Follow-up recommendations:  Activity:  as tolerated Diet:  regular Other:  keep follow up appointment  Kristine Linea, MD 10/04/2015, 9:22 AM

## 2015-10-04 NOTE — Plan of Care (Signed)
Problem: Alteration in thought process Goal: LTG-Patient has not harmed self or others in at least 2 days Outcome: Progressing Patient has displayed no self harm behaviors in at least 2 days

## 2015-10-04 NOTE — Plan of Care (Signed)
Problem: [facility] Participation in Recreation Therapeutic Interventions Goal: STG-Patient will identify at least five coping skills for ** STG: Coping Skills - Within 4 treatment sessions, patient will verbalize at least 5 coping skills for anger in each of 2 treatment sessions to increase anger management skills post d/c.  Outcome: Completed/Met Date Met:  10/04/15 Treatment Session 2; Completed 2 out of 2: At approximately 12:20 pm, LRT met with patient in patient room. Patient verbalized 5 coping skills for anger. LRT encouraged patient to use his coping skills when he felt himself getting angry to help calm himself down.  Intervention Used: Coping Skills worksheet  Leonette Monarch, LRT/CTRS 01.30.17 1:39 pm Goal: STG-Other Recreation Therapy Goal (Specify) STG: Stress Management - Within 4 treatment sessions, patient will verbalize understanding of the stress management techniques in each of 2 treatment sessions to increase stress management skills post d/c.  Outcome: Completed/Met Date Met:  10/04/15 Treatment Session 2; Completed 2 out of 2: At approximately 12:20 pm, LRT met with patient in patient room. Patient reported he read over and practiced the stress management techniques. Patient verbalized understanding and reported the techniques were helpful. LRT encouraged patient to continue practicing the stress management techniques. Intervention Used: Stress Management handouts  Leonette Monarch, LRT/CTRS 01.30.17 1:41 pm

## 2015-10-04 NOTE — Progress Notes (Signed)
Patient ID: Anthony Koch, male   DOB: March 28, 1996, 20 y.o.   MRN: [id number]  CSW attempted to contact the pt's mother and was unable to (this is the second attempt) reach er, CSw left a message.  CSW will continue to assess for appropriate contacts for the pt.

## 2015-10-04 NOTE — [facility] Suicide Risk Assessment (Addendum)
[facility] INPATIENT:  Family/Significant Other Suicide Prevention Education  Suicide Prevention Education:  Education Completed; Vonn Sliger (mother) [phone] has been identified by the patient as the family member/significant other with whom the patient will be residing, and identified as the person(s) who will aid the patient in the event of a mental health crisis (suicidal ideations/suicide attempt).  With written consent from the patient, the family member/significant other has been provided the following suicide prevention education, prior to the and/or following the discharge of the patient.  The suicide prevention education provided includes the following:  Suicide risk factors  Suicide prevention and interventions  National Suicide Hotline telephone number  [facility] assessment telephone number  [facility] Emergency Assistance 911  [facility] and/or Residential Mobile Crisis Unit telephone number  Request made of family/significant other to:  Remove weapons (e.g., guns, rifles, knives), all items previously/currently identified as safety concern.    Remove drugs/medications (over-the-counter, prescriptions, illicit drugs), all items previously/currently identified as a safety concern.  The family member/significant other verbalizes understanding of the suicide prevention education information provided.  The family member/significant other agrees to remove the items of safety concern listed above.  Lulu Riding, MSW, Theresia Majors [phone] 10/04/2015, 12:08 PM
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
## 2015-10-04 NOTE — Progress Notes (Signed)
D:  Patient expresses readiness for discharge today.  Patient denies any pain currently.  Patient denies suicidal ideation, homicidal ideation, auditory or visual hallucinations currently. A:  Medications and instructions for their use were reviewed with the patient and she voiced understanding.  Discharge instructions and follow up were reviewed with the patient.  Patient's belongings were returned upon her leaving the unit. R:  Patient signed for the return of her belongings.  Patient was cooperative with the discharge process.  Patient expressed understanding of discharge instructions, follow up, medications and their use.  Patient was escorted off the unit.  Patient remains safe at the time of discharge.     

## 2015-10-04 NOTE — Progress Notes (Addendum)
  [facility] Adult Case Management Discharge Plan :  Will you be returning to the same living situation after discharge:  Yes,  home with mom At discharge, do you have transportation home?: Yes,  mom will pick up Do you have the ability to pay for your medications: Yes,  patient pays for meds out of pocket and his parents also support patient in buying medications  Release of information consent forms completed and in the chart;  Patient's signature needed at discharge.  Patient to Follow up at: Follow-up Information    Follow up with [facility] Go on 10/06/2015.   Why:  For follow-up care appt on Wednesday 10/06/15 at 12:30pm   Contact information:   [address] [place], [place] [address] Ph [phone] Fax [phone]      Next level of care provider has access to [facility] Link:no  Safety Planning and Suicide Prevention discussed: Yes,  SPE discussed with patient and Clair Alfieri (mother) [phone]  Have you used any form of tobacco in the last 30 days? (Cigarettes, Smokeless Tobacco, Cigars, and/or Pipes): Yes  Has patient been referred to the Quitline?: Patient refused referral  Patient has been referred for addiction treatment: N/A  Lulu Riding, MSW, LCSWA [phone] 10/04/2015, 12:09 PM

## 2015-10-04 NOTE — Progress Notes (Signed)
Recreation Therapy Notes  INPATIENT RECREATION TR PLAN  Patient Details Name: Anthony Koch MRN: [id number] DOB: 1996/07/12 Today's Date: 10/04/2015  Rec Therapy Plan Is patient appropriate for Therapeutic Recreation?: Yes Treatment times per week: At least once a week TR Treatment/Interventions: 1:1 session, Group participation (Comment) (Appropriate participation in daily recreation therapy tx)  Discharge Criteria Pt will be discharged from therapy if:: Treatment goals are met, Discharged Treatment plan/goals/alternatives discussed and agreed upon by:: Patient/family  Discharge Summary Short term goals set: See Care Plan Short term goals met: Complete Progress toward goals comments: One-to-one attended One-to-one attended: Stress Management, Anger management Reason goals not met: N/A Therapeutic equipment acquired: None Reason patient discharged from therapy: Discharge from hospital Pt/family agrees with progress & goals achieved: Yes Date patient discharged from therapy: 10/04/15   Leonette Monarch, LRT/CTRS 10/04/2015, 4:58 PM

## 2015-12-05 ENCOUNTER — Emergency Department (HOSPITAL_COMMUNITY): Payer: Self-pay

## 2015-12-05 ENCOUNTER — Emergency Department (HOSPITAL_COMMUNITY)
Admission: EM | Admit: 2015-12-05 | Discharge: 2015-12-06 | Disposition: A | Discharge status: Home / Self Care | Payer: Self-pay | Attending: Emergency Medicine | Admitting: Emergency Medicine

## 2015-12-05 ENCOUNTER — Encounter (HOSPITAL_COMMUNITY): Payer: Self-pay

## 2015-12-05 DIAGNOSIS — M25512 Pain in left shoulder: Secondary | ICD-10-CM

## 2015-12-05 DIAGNOSIS — S4992XA Unspecified injury of left shoulder and upper arm, initial encounter: Secondary | ICD-10-CM | POA: Insufficient documentation

## 2015-12-05 DIAGNOSIS — Z8781 Personal history of (healed) traumatic fracture: Secondary | ICD-10-CM | POA: Insufficient documentation

## 2015-12-05 DIAGNOSIS — Z8659 Personal history of other mental and behavioral disorders: Secondary | ICD-10-CM | POA: Insufficient documentation

## 2015-12-05 DIAGNOSIS — Y998 Other external cause status: Secondary | ICD-10-CM | POA: Insufficient documentation

## 2015-12-05 DIAGNOSIS — W1839XA Other fall on same level, initial encounter: Secondary | ICD-10-CM | POA: Insufficient documentation

## 2015-12-05 DIAGNOSIS — Y9389 Activity, other specified: Secondary | ICD-10-CM | POA: Insufficient documentation

## 2015-12-05 DIAGNOSIS — Y9289 Other specified places as the place of occurrence of the external cause: Secondary | ICD-10-CM | POA: Insufficient documentation

## 2015-12-05 DIAGNOSIS — F17200 Nicotine dependence, unspecified, uncomplicated: Secondary | ICD-10-CM | POA: Insufficient documentation
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
## 2015-12-05 NOTE — Discharge Instructions (Signed)
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
## 2015-12-13 ENCOUNTER — Emergency Department (HOSPITAL_COMMUNITY): Payer: Self-pay

## 2015-12-13 ENCOUNTER — Emergency Department (HOSPITAL_COMMUNITY)
Admission: EM | Admit: 2015-12-13 | Discharge: 2015-12-14 | Disposition: A | Discharge status: Home / Self Care | Payer: Self-pay | Attending: Emergency Medicine | Admitting: Emergency Medicine

## 2015-12-13 ENCOUNTER — Encounter (HOSPITAL_COMMUNITY): Payer: Self-pay | Admitting: Emergency Medicine

## 2015-12-13 DIAGNOSIS — G44309 Post-traumatic headache, unspecified, not intractable: Secondary | ICD-10-CM

## 2015-12-13 DIAGNOSIS — S060X9A Concussion with loss of consciousness of unspecified duration, initial encounter: Secondary | ICD-10-CM | POA: Insufficient documentation

## 2015-12-13 DIAGNOSIS — W01198A Fall on same level from slipping, tripping and stumbling with subsequent striking against other object, initial encounter: Secondary | ICD-10-CM | POA: Insufficient documentation

## 2015-12-13 DIAGNOSIS — Y9339 Activity, other involving climbing, rappelling and jumping off: Secondary | ICD-10-CM | POA: Insufficient documentation

## 2015-12-13 DIAGNOSIS — Y998 Other external cause status: Secondary | ICD-10-CM | POA: Insufficient documentation

## 2015-12-13 DIAGNOSIS — F17200 Nicotine dependence, unspecified, uncomplicated: Secondary | ICD-10-CM | POA: Insufficient documentation

## 2015-12-13 DIAGNOSIS — Y9289 Other specified places as the place of occurrence of the external cause: Secondary | ICD-10-CM | POA: Insufficient documentation

## 2015-12-13 NOTE — ED Notes (Signed)
Called for room in Pod E, no answer

## 2015-12-13 NOTE — ED Notes (Signed)
Called for CT x3 no answer
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
## 2015-12-13 NOTE — ED Notes (Signed)
Called for vitals no answer °

## 2016-02-05 ENCOUNTER — Emergency Department: Admit: 2016-02-06 | Payer: Self-pay

## 2016-02-05 DIAGNOSIS — S062X0A Diffuse traumatic brain injury without loss of consciousness, initial encounter: Secondary | ICD-10-CM
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
## 2016-02-06 ENCOUNTER — Inpatient Hospital Stay
Admit: 2016-02-06 | Discharge: 2016-02-06 | Disposition: A | Discharge status: Home / Self Care | Payer: Self-pay | Attending: Emergency Medicine

## 2016-02-06 NOTE — ED Notes (Signed)
I have reviewed medications, follow up provider options, and discharge instructions with the patient. The patient verbalized understanding. Copy of discharge information given to patient upon discharge. Patient discharged in no distress. Patient ambulatory to waiting area. No questions at this time
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
## 2021-10-20 NOTE — ED Provider Notes (Signed)
-------------------------------------------------------------------------------  Attestation signed by Jetta Lout, MD at 10/22/2021  8:37 AM  I have evaluated the patient on the date of service and agree with the notes, assessments, and/or procedures performed by the resident and agree with her/his documentation and disposition.  -------------------------------------------------------------------------------     Department of Emergency Medicine  Anthony July, DO   Encounter Note  10/20/2021     Patient Name: Anthony Rice  MRN: [id number]  [facility] 03-09-1996     History of Present Illness   Chief Complaint: Vomiting and Diarrhea    History obtained from: the patient.  Anthony Rice is a 26 y.o. male presents to the emergency department secondary to vomiting and diarrhea with some episodes of fecal incontinence that has been happening since November.  He denies any specific symptoms November that may have triggered this.  He denies abdominal pain black or bloody stools with his stool incontinence as well as any pain with defecation.  He notes that he feels nauseous at work sometimes and has episodes of vomiting that are nonbloody.  Notes that he smokes marijuana every day has history of hep C but has not used any other IV drug use.  He denies any rashes or any trouble swallowing or no pain.  He notes that his work wanted a note as well secondary to him having a call out 2 days in a row.  Stand       Prior History   Allergies, Medications, Medical, Surgical, and Social History were reviewed as documented below.  Allergies: is allergic to shellfish containing products.  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  PSHx: has a past surgical history that includes Urethral dilation.  SocHx: reports that he has been smoking. He has a 7.00 pack-year smoking history. He has never used smokeless tobacco. He reports current drug use. Drugs: Marijuana and Cocaine.  He reports that he does not drink alcohol.  Medications: has a current medication list which includes the following long-term medication(s): diazepam, guanfacine, naproxen, nicotine, olanzapine, ondansetron, and tramadol.      Review of Systems (2-9 systems for level 4, 10 or more for level 5)    Review of Systems   Constitutional:  Negative for appetite change and fever.   HENT:  Negative for congestion and sore throat.    Respiratory:  Negative for cough, chest tightness, shortness of breath and wheezing.    Cardiovascular:  Negative for chest pain.   Gastrointestinal:  Positive for nausea and vomiting. Negative for abdominal distention, abdominal pain, anal bleeding, blood in stool and diarrhea.   Genitourinary:  Negative for dysuria and urgency.   Musculoskeletal:  Negative for neck pain and neck stiffness.   Skin:  Negative for rash and wound.   Neurological:  Negative for dizziness, weakness, light-headedness and numbness.          Physical Exam (up to 7 for level 4, 8 or more for level 5)     Current Range (last 24 hours)   Temp: 98.4 F (36.9 C)  [98.4 F (36.9 C)]    HR: 71  [52-71]    RR: 19  [18-20]    BP: (!) 140/68 (118-140)/(68-85)    Sats: 94 %  [94 %]    Ideal body weight: 75.3 kg (166 lb 0.1 oz)  Adjusted ideal body weight: 86 kg (189 lb 9.7 oz)    Physical Exam:   Vitals: BP (!) 140/68  Pulse 71   Temp 98.4 F (36.9 C)   Resp 19   Ht 180.3 cm (71")   Wt 102 kg (225 lb)   SpO2 94%   BMI 31.38 kg/m   Physical Exam  Vitals and nursing note reviewed.   Constitutional:       General: He is not in acute distress.     Appearance: He is not toxic-appearing.   HENT:      Head: Normocephalic and atraumatic.      Right Ear: External ear normal.      Left Ear: External ear normal.   Eyes:      General: Lids are normal. No scleral icterus.        Right eye: No discharge.         Left eye: No discharge.   Cardiovascular:      Rate and Rhythm: Normal rate and regular rhythm.   Pulmonary:      Effort:  Pulmonary effort is normal. No respiratory distress.      Breath sounds: No stridor.   Abdominal:      General: There is no distension.      Palpations: Abdomen is soft.      Tenderness: There is no abdominal tenderness. There is no right CVA tenderness, left CVA tenderness or guarding.   Musculoskeletal:         General: No deformity or signs of injury.      Cervical back: Neck supple. No tenderness.      Right lower leg: No edema.      Left lower leg: No edema.   Skin:     General: Skin is dry.      Capillary Refill: Capillary refill takes less than 2 seconds.      Coloration: Skin is not jaundiced.      Findings: No bruising or lesion.   Neurological:      General: No focal deficit present.      Mental Status: He is alert and oriented to person, place, and time.      Cranial Nerves: No cranial nerve deficit.      Motor: No weakness.      Gait: Gait normal.   Psychiatric:         Mood and Affect: Mood normal.         Behavior: Behavior normal.       Labs Reviewed   COMPREHENSIVE METABOLIC PANEL (CMP) - Abnormal; Notable for the following components:       Result Value    AST 46 (*)     ALT 81 (*)     All other components within normal limits    Narrative:     Fasting Glucose:  Impaired Fasting 100 - 125 mg/dL  Diagnostic of Diabetes Mellitus >/= 126 mg/dL    Random Glucose:  Reference: 70 - 139 mg/dL  Diagnostic of Diabetes Mellitus >/= 200 mg/dL   CBC WITH DIFFERENTIAL - Abnormal; Notable for the following components:    Neutrophils, Abs 6.60 (*)     Monocytes, Abs 0.90 (*)     All other components within normal limits    Narrative:     This is an appended report.  These results have been appended to a previously verified report.   MAGNESIUM (MG) - Normal   PHOSPHORUS - Normal   LIPASE - Normal   HIV1,2 ANTIGEN/ANTIBODY COMBO SCREEN - Normal    Narrative:     HIV-1 p24 Ag and HIV-1/HIV-2 Ab Non  Reactive:  A test result that is nonreactive does not exclude the possibility of exposure to or infection with HIV-1 and  HIV-2.  Nonreactive results in this assay for individuals with prior exposure to HIV-1 and/or HIV-2 may be due to antigen and antibody levels that are below the limit of detection of this assay.   STOOL CULTURE       Ultrasound - Point of Care                      Medical Decision Making     following detailed history and physical exam, patient's clinical findings are most consistent with nausea vomiting with no signs of intra-abdominal surgical emergencies.  No indication for imaging.  Electrolytes within limits with no focal logical deficits.  Have suspicion that marijuana may be contributing to his symptoms but also discussed that may need to see a GI specialist in order to discuss his episodes of stool incontinence that are infrequent.  No signs of back pain with no red flag symptoms for any causes of incontinence at this time.  He is appropriate with no other medical findings on exam at this time.  CBC CMP unremarkable.  Received a liter of fluids looked hydrated on exam.    I discussed with the patient and/or family that evaluation in the ED does not suggest any emergent or life-threatening condition, or medical condition requiring immediate intervention beyond what was provided in the ED, and I believe patient is safe for discharge.  Regardless, an unremarkable evaluation in the ED does not preclude the development or presence of a serious or life threatening condition, and this was also discussed with the patient.     Routine discharge counseling provided, all questions were answered, the importance of appropriate follow up was discussed, and the patient and / or caretaker understands that worsening, changing or persistent symptoms should prompt an immediate call or follow up with their primary physician or return to the emergency department.   Patient was instructed to return immediately for any worsening or change in current symptoms, or if symptoms do not continue to improve.  Patient and / or caretaker  is agreeable to this plan, they agree to follow up as directed.  More extensive discharge instructions were given in the patient's discharge paperwork to include following up with their primary care provider, own specialist, or medical provider that I am recommending.  I believe patient is safe for discharge and the patient is discharged home in stable condition.      Amount and/or complexity of Data Reviewed:  I acquired additional history from the following independent historians: Not applicable    External data reviewed:  I have reviewed external data as follows: Outpatient visit(s)    Diagnostic studies reviewed:  I have reviewed the available: Laboratory results    Discussion of management or test interpretation with external provider(s):  I have discussed the management of this patient or their diagnostic tests with the following external provider(s): Not applicable    The following feature(s) contributed to the complexity (risk) of the management of this patient:  Prescription medication management and Diagnosis or treatment significant limited by social determinants of health    See ED course below for further MDM updates.     ED Course    ED Course as of 10/20/21 1109   Thu Oct 20, 2021   1041 WBC: 10.1 [TN]   1041 Hemoglobin: 16.1 [TN]   1041 Point-of-care ultrasound obtained from  right upper quadrant with no signet findings for acute cholecystitis. [TN]   1052 Lipase: 25 [TN]   1052 HIV 1,2 Ag/Ab Combo Screen: Non-Reactive [TN]   1052 Magnesium: 1.8 [TN]   1052 Sodium: 137 [TN]   1052 Potassium: 4.7 [TN]      ED Course User Index  [TN] Anthony July, DO         Clinical Impressions as of 10/20/21 1109   Nausea and vomiting in adult   Marijuana use, continuous   Incontinence of feces, unspecified fecal incontinence type   History of hepatitis C          Disposition   Impression:   1. Nausea and vomiting in adult    2. Marijuana use, continuous    3. Incontinence of feces, unspecified fecal incontinence  type    4. History of hepatitis C      Condition: Stable  Disposition: DISCHARGE. On reassessment, patient is hemodynamically stable, well appearing, in no apparent distress. Discussed the limitations of our testing/workup. Patient/Family given strict return instructions at bedside and in discharge papers and informed that their workup today focused on underlying urgent / emergent pathology. Ultimately instructed to follow up with their primary care doctor to discuss their ER visit and for further evaluation of their testing / results. They were agreeable to this, verbalized understanding, and were without further questions.       This chart has been completed using [place], and while attempts have been made to ensure accuracy, certain words and phrases may not be transcribed as intended.

## 2022-10-23 DIAGNOSIS — L0501 Pilonidal cyst with abscess: Secondary | ICD-10-CM

## 2022-10-23 NOTE — ED Provider Notes (Shared)
Emergency Department Provider Note       PCP: Dorise Hiss, DO   Age: 27 y.o.   Sex: male     DISPOSITION       No diagnosis found.    Medical Decision Making     Complexity of Problems Addressed:  {Complexity:56549}    Data Reviewed and Analyzed:  I independently ordered and reviewed each unique test.  {external source:53765}   {Historian (state who, why needed, what they said):56592}    {test reviewed:53725}    Discussion of management or test interpretation.  ***        {Admitted or Consultants involved.:58337}    Risk of Complications and/or Morbidity of Patient Management:  {AVWU:98119}    {MIPS URI - Strep - Sinusitis - Pregnant - Head Trauma - Overdose - Agitation:67386}    History       Patient presents with right upper buttock wound.  He says it is a "spider bite "but never did see a spider.  He says this is the second time that he has had an infection in this area.  He says this has been about 2 to 3 days with progressive worsening.  He denies any fevers or any other symptoms         Review of Systems   All other systems reviewed and are negative.      Physical Exam     Vitals signs and nursing note reviewed:  Vitals:    10/23/22 2312   BP: (!) 149/77   Pulse: 66   Resp: 18   Temp: 98.9 F (37.2 C)   TempSrc: Oral   SpO2: 99%   Weight: 90.7 kg (200 lb)   Height: 1.803 m (5\' 11" )      Physical Exam  Constitutional:       General: He is not in acute distress.  HENT:      Head: Normocephalic and atraumatic.      Right Ear: External ear normal.      Left Ear: External ear normal.      Nose: No congestion or rhinorrhea.      Mouth/Throat:      Mouth: Mucous membranes are moist.   Eyes:      Extraocular Movements: Extraocular movements intact.      Pupils: Pupils are equal, round, and reactive to light.   Cardiovascular:      Rate and Rhythm: Normal rate and regular rhythm.   Pulmonary:      Effort: Pulmonary effort is normal.   Abdominal:      General: There is no distension.      Palpations: Abdomen  is soft.      Tenderness: There is no abdominal tenderness.   Musculoskeletal:         General: No swelling or tenderness. Normal range of motion.      Cervical back: Normal range of motion.   Skin:     Findings: No rash.      Comments: Right upper medial buttock sacral coccygeal area 3 cm circular area of induration with central fluctuance and tenderness to palpation   Neurological:      General: No focal deficit present.      Mental Status: He is alert and oriented to person, place, and time.   Psychiatric:         Mood and Affect: Mood normal.          Procedures     Procedures    No orders  of the defined types were placed in this encounter.       Medications given during this emergency department visit:  Medications   lidocaine-EPINEPHrine-tetracaine (LET) topical gel 3 mL syringe (has no administration in time range)   lidocaine-EPINEPHrine 1 %-1:100000 injection 20 mL (has no administration in time range)       New Prescriptions    No medications on file        No past medical history on file.     No past surgical history on file.     Social History     Socioeconomic History    Marital status: Single        Previous Medications    No medications on file        No results found for any visits on 10/23/22.     No orders to display                     Voice dictation software was used during the making of this note.  This software is not perfect and grammatical and other typographical errors may be present.  This note has not been completely proofread for errors.

## 2022-10-23 NOTE — ED Triage Notes (Signed)
Pt ambulatory into triage. Pt reports a spider bite to his butt that happened about 3-4 days ago. Pt reports bite is painful to sit with redness around area.

## 2022-10-24 ENCOUNTER — Inpatient Hospital Stay
Admit: 2022-10-24 | Discharge: 2022-10-24 | Disposition: A | Discharge status: Home / Self Care | Attending: General Practice

## 2022-10-24 MED ORDER — LIDOCAINE-EPINEPHRINE 1 %-1:100000 IJ SOLN
1-1100000 %-:00000 | Freq: Once | INTRAMUSCULAR | Status: AC
Start: 2022-10-24 — End: 2022-10-24
  Administered 2022-10-24: 05:00:00 20 mL via INTRADERMAL

## 2022-10-24 MED ORDER — SULFAMETHOXAZOLE-TRIMETHOPRIM 800-160 MG PO TABS
800-160 | ORAL_TABLET | Freq: Two times a day (BID) | ORAL | 0 refills | Status: AC
Start: 2022-10-24 — End: 2022-10-31

## 2022-10-24 MED ORDER — LIDO-EPINEPHRINE-TETRACAINE 4-0.18-0.5 % EX GEL
CUTANEOUS | Status: AC
Start: 2022-10-24 — End: 2022-10-23
  Administered 2022-10-24: 05:00:00 3 mL via TOPICAL

## 2022-10-24 MED FILL — STERILE TOPICAL L.E.T. GEL 0.18-4-0.5 % EX GEL: CUTANEOUS | Qty: 3

## 2022-10-24 MED FILL — LIDOCAINE-EPINEPHRINE 1 %-1:100000 IJ SOLN: 1 %-:00000 | INTRAMUSCULAR | Qty: 20

## 2022-10-24 NOTE — ED Notes (Signed)
I have reviewed discharge instructions with the patient.  The patient verbalized understanding.    Patient left ED via Discharge Method: ambulatory to Home with self.    Opportunity for questions and clarification provided.       Patient given 1 scripts.         To continue your aftercare when you leave the hospital, you may receive an automated call from our care team to check in on how you are doing.  This is a free service and part of our promise to provide the best care and service to meet your aftercare needs." If you have questions, or wish to unsubscribe from this service please call [phone].  Thank you for Choosing our [place] [facility].

## 2022-10-26 ENCOUNTER — Inpatient Hospital Stay
Admit: 2022-10-26 | Discharge: 2022-10-26 | Disposition: A | Discharge status: Home / Self Care | Attending: Emergency Medicine

## 2022-10-26 DIAGNOSIS — L0591 Pilonidal cyst without abscess: Secondary | ICD-10-CM

## 2022-10-26 NOTE — Discharge Instructions (Signed)
Warm water over area of discomfort.  Continue to push on the area to express any additional drainage.  Begin taking your antibiotic.  Take Tylenol Motrin as needed.  Return if redness swelling or worsening pain or signs of infection.

## 2022-10-26 NOTE — ED Triage Notes (Signed)
Pt had a pilonidal cyst lanced on 2/20. Pt was told if the packing came out to return to ED, packing came out two days ago.

## 2022-10-26 NOTE — ED Notes (Signed)
I have reviewed discharge instructions with the patient.  The patient verbalized understanding.    Patient left ED via Discharge Method: ambulatory to Home with self.    Opportunity for questions and clarification provided.       Patient given 0 scripts.         To continue your aftercare when you leave the hospital, you may receive an automated call from our care team to check in on how you are doing.  This is a free service and part of our promise to provide the best care and service to meet your aftercare needs." If you have questions, or wish to unsubscribe from this service please call [phone].  Thank you for Choosing our  Emergency Department.

## 2022-10-26 NOTE — ED Provider Notes (Signed)
Emergency Department Provider Note       PCP: Anthony Shook, DO   Age: 27 y.o.   Sex: male     DISPOSITION Decision To Discharge 10/26/2022 03:21:48 PM       ICD-10-CM    1. Pilonidal cyst of natal cleft  L7.91           Medical Decision Making     The wound was additionally palpated.  There is a small amount of drainage that appear serous and thin in nature.  Less than 1 cc output.  No additional cellulitis noted.  Patient has not started antibiotic.  Recommend to take Bactrim.  Warm compress and hot water over the area and continue to push out any additional drainage.  He is in agreement with plan.  Stable for discharge.     1 acute, uncomplicated illness or injury.  Shared medical decision making was utilized in creating the patients health plan today.    I independently ordered and reviewed each unique test.  I reviewed external records: Ed visit         History     HPI  13 male returning with wound check for his abscess on the buttocks.  Was here 2 days ago.  The packing fell out.  He wanted to make sure everything was still okay.  He has not started the antibiotic that was written 2 days ago.  Denies any fever, weakness tingling or numbness  ROS     Review of Systems   Constitutional: Negative.  Negative for fever.   HENT: Negative.     Respiratory: Negative.     Cardiovascular: Negative.    Gastrointestinal: Negative.    Genitourinary: Negative.    Musculoskeletal:  Negative for back pain.   Skin:  Positive for wound.   Neurological: Negative.         Physical Exam     Vitals signs and nursing note reviewed:  Vitals:    10/26/22 1454   BP: 123/83   Pulse: 66   Resp: 18   Temp: 98.1 F (36.7 C)   TempSrc: Oral   SpO2: 95%   Weight: 88.5 kg (195 lb)   Height: 1.803 m (5' 11"$ )      Physical Exam  Vitals and nursing note reviewed.   Constitutional:       General: He is not in acute distress.     Appearance: Normal appearance. He is not ill-appearing.   HENT:      Head: Normocephalic.   Cardiovascular:       Rate and Rhythm: Normal rate.      Pulses: Normal pulses.   Pulmonary:      Effort: Pulmonary effort is normal.   Musculoskeletal:         General: No tenderness or signs of injury. Normal range of motion.      Cervical back: Normal range of motion and neck supple.      Comments: At the gluteal cleft there is a small area consistent with a recently in size abscess.  On palpation there is very minimal drainage.  Thin serous like less than 1 cc output.  No associated cellulitis noted.  Sensation intact throughout.  It is not tender to palpation   Skin:     General: Skin is warm.      Capillary Refill: Capillary refill takes less than 2 seconds.   Neurological:      General: No focal deficit present.  Mental Status: He is alert. Mental status is at baseline.      Cranial Nerves: No cranial nerve deficit.      Sensory: No sensory deficit.        Procedures     Procedures    No orders of the defined types were placed in this encounter.       Medications given during this emergency department visit:  Medications - No data to display    New Prescriptions    No medications on file        History reviewed. No pertinent past medical history.     History reviewed. No pertinent surgical history.     Social History     Socioeconomic History    Marital status: Single     Spouse name: None    Number of children: None    Years of education: None    Highest education level: None   Tobacco Use    Smoking status: Never    Smokeless tobacco: Never   Substance and Sexual Activity    Alcohol use: Never    Drug use: Never        Previous Medications    SULFAMETHOXAZOLE-TRIMETHOPRIM (BACTRIM DS;SEPTRA DS) 800-160 MG PER TABLET    Take 1 tablet by mouth 2 times daily for 7 days        No results found for any visits on 10/26/22.      No orders to display                No results for input(s): "COVID19" in the last 72 hours.    Voice dictation software was used during the making of this note.  This software is not perfect and  grammatical and other typographical errors may be present.  This note has not been completely proofread for errors.     Geanie Kenning, MD  10/26/22 [phone]

## 2023-02-19 ENCOUNTER — Inpatient Hospital Stay
Admit: 2023-02-19 | Discharge: 2023-02-19 | Disposition: A | Discharge status: Home / Self Care | Attending: Emergency Medicine

## 2023-02-19 ENCOUNTER — Emergency Department: Admit: 2023-02-19 | Primary: Family Medicine

## 2023-02-19 DIAGNOSIS — M79672 Pain in left foot: Secondary | ICD-10-CM

## 2023-02-19 MED ORDER — KETOROLAC TROMETHAMINE 10 MG PO TABS
10 | ORAL_TABLET | Freq: Four times a day (QID) | ORAL | 0 refills | Status: AC | PRN
Start: 2023-02-19 — End: ?

## 2023-02-19 MED ORDER — LIDOCAINE HCL 1% INJ (MIXTURES ONLY)
1 | Freq: Once | INTRAMUSCULAR | Status: AC
Start: 2023-02-19 — End: 2023-02-19
  Administered 2023-02-19: 21:00:00 20 mL via INTRADERMAL

## 2023-02-19 MED FILL — LIDOCAINE HCL 1 % IJ SOLN: 1 % | INTRAMUSCULAR | Qty: 20

## 2023-02-19 NOTE — ED Provider Notes (Signed)
Emergency Department Provider Note       PCP: Dorise Hiss, DO   Age: 27 y.o.   Sex: male     DISPOSITION Decision To Discharge 02/19/2023 04:55:37 PM       ICD-10-CM    1. Left foot pain  M79.672 AFL - [place]out[place], [place], DPM, [employer], [place]          Medical Decision Making     Patient is a 27 year old male with no significant past medical history who states he stepped on a piece of glass left foot approximate 3 months ago and has had a area of pain and firmness.  Patient denies any draining wound denies any fevers or chills and denies any history of diabetes.    The history is provided by the patient.   Foot Problem  Location:  Foot  Time since incident:  3 months  Injury: yes    Foot location:  L foot  Pain details:     Quality:  Dull and aching    Radiates to:  Does not radiate    Severity:  Mild    Onset quality:  Gradual    Timing:  Constant    Progression:  Waxing and waning  Chronicity:  Chronic  Foreign body present:  Glass (Possible glass)  Prior injury to area:  No  Relieved by:  Nothing  Worsened by:  Nothing  Ineffective treatments:  None tried  Associated symptoms: no back pain, no fatigue, no fever, no itching, no neck pain, no numbness, no stiffness, no swelling and no tingling      Differential diagnosis includes but is not limited to left foot foreign body left foot callus    Patient's physical exam is remarkable for left foot    Firm callus like area left plantar surface proximal to the right fourth and fifth toe.     My independent interpretation of patient's left foot x-rays negative for foreign body.  Radiologist initially read a foreign body which is actually the marker that the radiology tech placed on the skin show the area of pain.  Therefore that is not a foreign body.  Patient could have been on radiopaque foreign body.  Patient will be referred to podiatry.  Patient has no draining wound or redness in that area and therefore antibiotics would not be  given.  Patient voices understanding and agrees to follow-up.  All questions answered.  Skin:     1 or more acute illnesses that pose a threat to life or bodily function.   Shared medical decision making was utilized in creating the patients health plan today.    I independently ordered and reviewed each unique test.       I interpreted the X-rays left foot NO FB.              History     Patient is a 27 year old male with no significant past medical history who states he stepped on a piece of glass left foot approximate 3 months ago and has had a area of pain and firmness.  Patient denies any draining wound denies any fevers or chills and denies any history of diabetes.    The history is provided by the patient.   Foot Problem  Location:  Foot  Time since incident:  3 months  Injury: yes    Foot location:  L foot  Pain details:     Quality:  Dull and aching    Radiates to:  Does not radiate    Severity:  Mild    Onset quality:  Gradual    Timing:  Constant    Progression:  Waxing and waning  Chronicity:  Chronic  Foreign body present:  Glass (Possible glass)  Prior injury to area:  No  Relieved by:  Nothing  Worsened by:  Nothing  Ineffective treatments:  None tried  Associated symptoms: no back pain, no fatigue, no fever, no itching, no neck pain, no numbness, no stiffness, no swelling and no tingling        ROS     Review of Systems   Constitutional:  Negative for fatigue and fever.   Musculoskeletal:  Negative for back pain, neck pain and stiffness.        Left foot plantar surface pain   Skin:  Negative for itching.   All other systems reviewed and are negative.       Physical Exam     Vitals signs and nursing note reviewed:  Vitals:    02/19/23 1518 02/19/23 1524   BP: 120/67    Pulse: 67    Resp: 16    Temp: 97.9 F (36.6 C)    SpO2: 98%    Weight:  81.6 kg (180 lb)   Height:  1.803 m (5\' 11" )      Physical Exam  Vitals and nursing note reviewed.   Constitutional:       Appearance: Normal appearance.   HENT:       Head: Normocephalic and atraumatic.      Nose: Nose normal.      Mouth/Throat:      Mouth: Mucous membranes are moist.   Eyes:      Extraocular Movements: Extraocular movements intact.      Conjunctiva/sclera: Conjunctivae normal.      Pupils: Pupils are equal, round, and reactive to light.   Cardiovascular:      Rate and Rhythm: Normal rate.      Pulses: Normal pulses.      Heart sounds: Normal heart sounds.   Pulmonary:      Effort: Pulmonary effort is normal.      Breath sounds: Normal breath sounds.   Abdominal:      General: Abdomen is flat.   Musculoskeletal:         General: Normal range of motion.      Cervical back: Normal range of motion and neck supple.      Comments: Firm callus like area left plantar surface proximal to the right fourth and fifth toe.   Skin:     General: Skin is warm.      Capillary Refill: Capillary refill takes less than 2 seconds.   Neurological:      General: No focal deficit present.      Mental Status: He is alert and oriented to person, place, and time.   Psychiatric:         Mood and Affect: Mood normal.         Behavior: Behavior normal.         Thought Content: Thought content normal.         Judgment: Judgment normal.        Procedures     Procedures    Orders Placed This Encounter   Procedures    XR FOOT LEFT (MIN 3 VIEWS)    AFL - [place]outas, [place], DPM, [employer] [address], [place]        Medications given during this emergency  department visit:  Medications   lidocaine 1 % injection 20 mL (20 mLs IntraDERmal Given 02/19/23 1644)       New Prescriptions    KETOROLAC (TORADOL) 10 MG TABLET    Take 1 tablet by mouth every 6 hours as needed for Pain        No past medical history on file.     No past surgical history on file.     Social History     Socioeconomic History    Marital status: Single   Tobacco Use    Smoking status: Never    Smokeless tobacco: Never   Substance and Sexual Activity    Alcohol use: Never    Drug use: Never        Previous  Medications    No medications on file        Results for orders placed or performed during the hospital encounter of 02/19/23   XR FOOT LEFT (MIN 3 VIEWS)    Narrative    Left Foot    INDICATION: Wound on the plantar aspect of the left foot between the fourth and  fifth toes.    COMPARISON:  None    TECHNIQUE: Three views of the left foot were obtained    FINDINGS: There is no evidence of fracture or other acute bony abnormality in  the foot. No bony lesions are seen. There is a 3 mm rounded foreign body in the  plantar soft tissue at the level of the metatarsophalangeal joint, between the  fourth and fifth toes. No subcutaneous emphysema is evident.      Impression    1. There is a 3 mm rounded foreign body in the plantar soft tissue at the level  of the metatarsophalangeal joint, between the fourth and fifth toes.  2. There is no fracture, dislocation or subcutaneous emphysema.      Electronically signed by Weber Cooks NGUYEN         XR FOOT LEFT (MIN 3 VIEWS)   Final Result   1. There is a 3 mm rounded foreign body in the plantar soft tissue at the level   of the metatarsophalangeal joint, between the fourth and fifth toes.   2. There is no fracture, dislocation or subcutaneous emphysema.         Electronically signed by Georgiann Mccoy                   No results for input(s): "COVID19" in the last 72 hours.    Voice dictation software was used during the making of this note.  This software is not perfect and grammatical and other typographical errors may be present.  This note has not been completely proofread for errors.       [address], [place], [place]  02/20/23 [phone]

## 2023-02-19 NOTE — ED Triage Notes (Signed)
Pt arrives to the ER with c/o glass fragment in the Left foot x 3 months. Pt states pain has increased and is making ambulating intolerable.

## 2023-02-19 NOTE — ED Notes (Signed)
Pt d/c home, rx and d/c paeprs given, all questions answered, pt left on foot.

## 2023-04-12 ENCOUNTER — Inpatient Hospital Stay
Admit: 2023-04-12 | Discharge: 2023-04-12 | Disposition: A | Discharge status: Home / Self Care | Attending: Emergency Medicine

## 2023-04-12 ENCOUNTER — Emergency Department: Admit: 2023-04-12 | Primary: Family Medicine

## 2023-04-12 DIAGNOSIS — S93491A Sprain of other ligament of right ankle, initial encounter: Secondary | ICD-10-CM

## 2023-04-12 MED ORDER — IBUPROFEN 800 MG PO TABS
800 | ORAL | Status: DC
Start: 2023-04-12 — End: 2023-04-12

## 2023-04-12 MED FILL — IBUPROFEN 800 MG PO TABS: 800 MG | ORAL | Qty: 1

## 2023-04-12 NOTE — ED Notes (Signed)
Patient mobility status  with no difficulty. Provider aware     I have reviewed discharge instructions with the patient.  The patient verbalized understanding.    Patient left ED via Discharge Method: Crutches to Home with  significant other .    Opportunity for questions and clarification provided.     Patient given 0 scripts.

## 2023-04-12 NOTE — Discharge Instructions (Addendum)
Alternate 4 ibuprofen every 8 hours with 2 extra-strength tylenol every 6 hours

## 2023-04-12 NOTE — ED Provider Notes (Signed)
Emergency Department Provider Note       PCP: Dorise Hiss, DO   Age: 27 y.o.   Sex: male     DISPOSITION Decision To Discharge 04/12/2023 09:25:59 AM  Condition at Disposition: Good       ICD-10-CM    1. Sprain of anterior talofibular ligament of right ankle, initial encounter  S93.491A           Medical Decision Making     Acute ankle pain concerning for fracture versus sprain.  X-rays are negative, suspect tendinous strain, patient declines any ibuprofen saying he does not like taking pills, we will provide a walker boot and crutches and ice pack for him     1 acute, uncomplicated illness or injury.  Over the counter drug management performed.  Patient was discharged risks and benefits of hospitalization were considered.  Shared medical decision making was utilized in creating the patients health plan today.    I independently ordered and reviewed each unique test.       I interpreted the X-rays foot and ankle x-ray series are negative for fracture or dislocation.              History     27 year old gentleman presents to the ER with right foot and ankle pain.  [place], and his foot got caught between 2 rocks as he was sliding down.  He has pain and swelling to the ankle and is not really able to put hardly any weight on it.  No knee pain, no other injuries.  He sustained no laceration.        ROS     Review of Systems   Musculoskeletal:  Positive for arthralgias, gait problem and joint swelling.   Skin:  Negative for color change and wound.   Neurological:  Negative for weakness and numbness.   All other systems reviewed and are negative.       Physical Exam     Vitals signs and nursing note reviewed:  Vitals:    04/12/23 0741   BP: 111/66   Pulse: 66   Resp: 17   Temp: 98.4 F (36.9 C)   TempSrc: Oral   SpO2: 100%   Weight: 86.2 kg (190 lb)   Height: 1.803 m (5\' 11" )      Physical Exam  Vitals and nursing note reviewed.   Constitutional:       General: He is not  in acute distress.     Appearance: Normal appearance. He is not ill-appearing.   HENT:      Head: Normocephalic and atraumatic.      Right Ear: External ear normal.      Left Ear: External ear normal.      Nose: Nose normal. No rhinorrhea.   Eyes:      General: No scleral icterus.        Right eye: No discharge.         Left eye: No discharge.      Extraocular Movements: Extraocular movements intact.   Pulmonary:      Effort: Pulmonary effort is normal. No respiratory distress.   Musculoskeletal:         General: Swelling, tenderness and signs of injury present. No deformity.      Cervical back: Normal range of motion and neck supple.      Right ankle: Swelling present. Tenderness present over the lateral malleolus, medial malleolus and AITF ligament. No base  of 5th metatarsal or proximal fibula tenderness. Decreased range of motion.   Skin:     General: Skin is warm and dry.      Coloration: Skin is not jaundiced or pale.   Neurological:      General: No focal deficit present.      Mental Status: He is alert and oriented to person, place, and time. Mental status is at baseline.      Gait: Gait normal.   Psychiatric:         Mood and Affect: Mood normal.         Behavior: Behavior normal.      Procedures     Procedures    Orders Placed This Encounter   Procedures    XR FOOT RIGHT (MIN 3 VIEWS)    XR ANKLE RIGHT (MIN 3 VIEWS)    [employer] Walker Boot, Air Select Hi Top, Right; LG (M10-13/F11-15)    [employer] Crutches; Pair, Right Side Injury; Tall (5'10"-6'6")        Medications given during this emergency department visit:  Medications - No data to display    Discharge Medication List as of 04/12/2023  9:41 AM           History reviewed. No pertinent past medical history.     History reviewed. No pertinent surgical history.     Social History     Socioeconomic History    Marital status: Single     Spouse name: None    Number of children: None    Years of education: None     Highest education level: None   Tobacco Use    Smoking status: Never    Smokeless tobacco: Never   Substance and Sexual Activity    Alcohol use: Never    Drug use: Never     Social Determinants of Health     Social Connections: Unknown (11/21/2019)    Received from [facility]    Social Connections     Frequency of Communication with Friends and Family: Not asked     Frequency of Social Gatherings with Friends and Family: Not asked   Intimate Partner Violence: Unknown (11/21/2019)    Received from [facility]    Intimate Partner Violence     Fear of Current or Ex-Partner: Not asked     Emotionally Abused: Not asked     Physically Abused: Not asked     Sexually Abused: Not asked   Housing Stability: Not At Risk (11/10/2020)    Received from [facility] Stability     Was there a time when you did not have a steady place to sleep: Not asked     Worried that the place you are staying is making you sick: Not asked        Discharge Medication List as of 04/12/2023  9:41 AM        CONTINUE these medications which have NOT CHANGED    Details   ketorolac (TORADOL) 10 MG tablet Take 1 tablet by mouth every 6 hours as needed for Pain, Disp-20 tablet, R-0Normal              Results for orders placed or performed during the hospital encounter of 04/12/23   XR FOOT RIGHT (MIN 3 VIEWS)    Narrative    Right Foot    INDICATION: foor injury    COMPARISON:  None    TECHNIQUE: Three views of the right  foot were obtained    FINDINGS: There is no evidence of fracture or other acute bony abnormality in  the foot. No bony lesions are seen. Soft tissue prominent.      Impression    No obvious acute bony fracture or joint dislocations.    Soft tissue prominence.      Electronically signed by [facility] TRIEU   XR ANKLE RIGHT (MIN 3 VIEWS)    Narrative    Right Ankle    INDICATION: ankle injury    COMPARISON:  None    TECHNIQUE: Three views of the right ankle were obtained    FINDINGS: There is no evidence of fracture or other acute  bony abnormality.  There are no bony lesions. The ankle mortise is intact.       Impression    Negative right ankle      Electronically signed by Jerline Pain         XR FOOT RIGHT (MIN 3 VIEWS)   Final Result   No obvious acute bony fracture or joint dislocations.      Soft tissue prominence.         Electronically signed by [facility] TRIEU      XR ANKLE RIGHT (MIN 3 VIEWS)   Final Result   Negative right ankle         Electronically signed by Jerline Pain                   No results for input(s): "COVID19" in the last 72 hours.    Voice dictation software was used during the making of this note.  This software is not perfect and grammatical and other typographical errors may be present.  This note has not been completely proofread for errors.       Ala Dach, MD  04/30/23 [phone]

## 2023-04-12 NOTE — ED Triage Notes (Signed)
Pt presents to ED with c/o pain to right foot and ankle after injury while sliding down rocks into a river yesterday.

## 2024-01-24 ENCOUNTER — Inpatient Hospital Stay: Admit: 2024-01-24 | Discharge: 2024-01-24 | Disposition: A | Discharge status: Home / Self Care

## 2024-01-24 DIAGNOSIS — S0502XA Injury of conjunctiva and corneal abrasion without foreign body, left eye, initial encounter: Secondary | ICD-10-CM

## 2024-01-24 MED ORDER — ERYTHROMYCIN 5 MG/GM OP OINT
5 | OPHTHALMIC | 0 refills | Status: AC
Start: 2024-01-24 — End: ?

## 2024-01-24 MED ORDER — FLUORESCEIN SODIUM 1 MG OP STRP
1 | OPHTHALMIC | Status: AC
Start: 2024-01-24 — End: 2024-01-24
  Administered 2024-01-24: 21:00:00 1 via OPHTHALMIC

## 2024-01-24 MED ORDER — TETRACAINE HCL 0.5 % OP SOLN
0.5 | Freq: Once | OPHTHALMIC | Status: AC
Start: 2024-01-24 — End: 2024-01-24
  Administered 2024-01-24: 21:00:00 1 [drp] via OPHTHALMIC

## 2024-01-24 MED FILL — BIO GLO 1 MG OP STRP: 1 MG | OPHTHALMIC | Qty: 1 | Fill #0

## 2024-01-24 MED FILL — TETRACAINE HCL 0.5 % OP SOLN: 0.5 % | OPHTHALMIC | Qty: 4 | Fill #0

## 2024-01-24 NOTE — Discharge Instructions (Signed)
 You have a small scratch on your eye which will heal on its own in about 3 to 4 days.  Use the antibiotic ointment to help prevent infection for the next week.  Use Tylenol and Motrin  as needed for pain.  Follow-up with ophthalmology as needed at the number above.  Return here for new or worsening symptoms.    As we discussed, I did not find a life threatening cause of your symptoms today. However, THAT DOES NOT MEAN IT COULD NOT DEVELOP. If you develop ANY new or worsening symptoms, it is critical that you return for re-evaluation. This includes any symptoms that are concerning to you, especially symptoms such as loss of vision, fevers, double vision, eye swelling, severe pain.  If you do not return for re-evaluation, you risk serious complications, including death.    It was my pleasure to take care of you today in the emergency department. Thank you for coming in today. I hope that we were able to help you out and make you more comfortable. I hope you have a speedy recovery! If you do get a survey in the mail asking how your care was, it really helps me and our department out if you respond. Thank you!

## 2024-01-24 NOTE — ED Provider Notes (Signed)
 Emergency Department Provider Note       [facility] EMERGENCY DEPT   PCP: Charlsie Cool, DO   Age: 28 y.o.   Sex: male     DISPOSITION Decision To Discharge 01/24/2024 04:45:24 PM    ICD-10-CM    1. Conjunctival abrasion, left, initial encounter  S05.02XA       2. Subconjunctival hemorrhage of left eye  H11.32           Medical Decision Making     In summary this is a 28 year old male patient presented for evaluation with evidence of conjunctival abrasion on the left side with associated subconjunctival hemorrhage.  Based on my evaluation I feel the patient is at low risk for alternative causes such as corneal ulceration, corneal laceration, infection, herpetic lesions, retained foreign body. The reasoning behind my decision process is that the patient is grossly well-appearing. No evidence of corneal ulceration. Seidel sign negative. Relief was obtained for the patient after using tetracaine  which is consistent with abrasion.  Small abrasion on the conjunctive on the temporal part of the left eye.  There is no  erythema or purulent discharge. No proptosis or ophthalmoplegia. No dendritic lesions. Lid eversion did not show foreign body. The plan for this patient is outpatient management. The patient was instructed to apply warm compresses and continue use of antibiotic eye ointment. The follow up for this patient will be on an as needed basis if symptoms worsen, persist, change. I have specifically counseled the patient on warning signs to return immediately for including but not limited to severe eye pain, worsening condition, visual changes. The patient has verbalized understanding and is in agreement with the treatment plan.  ED Course as of 01/24/24 1822   Thu Jan 24, 2024   1629 Vision 20/20 in affected eye.  Subconjunctival hemorrhage noted.  Will stain eye to rule out abrasion [NR]      ED Course User Index  [NR] Arron Large, PA     1 acute, uncomplicated illness or injury.  Over the counter drug  management performed.  Prescription drug management performed.  Patient was discharged risks and benefits of hospitalization were considered.  Shared medical decision making was utilized in creating the patients health plan today.  I independently ordered and reviewed each unique test.  ED attending physician present in department at time of care. Based on current hospital policy, their co-signature is not required on this note.                       History     28 year old male patient presents today complaining of left eye injury that occurred around 10 PM last night.  Reports he was walking outside and he accidentally hit himself in the eye with a tree branch.  Reports he thinks he has a laceration to his eye because he noticed some blood in it today.  He reports it is painful to move his eye around and that his vision is intermittently blurry.  He denies any fevers or chills.  Denies loss of vision . denies diplopia.  Denies photopsia.  Denies orbital pain.  Denies discharge from the eye.  Patient is primary historian.  Denies contact lens or glasses wear.  Denies foreign body sensation.    The history is provided by the patient. No language interpreter was used.       ROS     Review of Systems   Constitutional:  Negative for fever.  HENT:  Negative for facial swelling and trouble swallowing.    Eyes:  Positive for pain, redness and visual disturbance. Negative for photophobia.   Respiratory:  Negative for cough and shortness of breath.    Cardiovascular:  Negative for chest pain.   Gastrointestinal:  Negative for abdominal pain and vomiting.   Genitourinary:  Negative for dysuria.   Musculoskeletal:  Negative for myalgias and neck stiffness.   Skin:  Negative for rash and wound.   Neurological:  Negative for dizziness, syncope, weakness and light-headedness.   Psychiatric/Behavioral:  Negative for self-injury and suicidal ideas.         Physical Exam     Vitals signs and nursing note reviewed:  Vitals:     01/24/24 1621   BP: 119/68   Pulse: 78   Resp: 17   Temp: 98.1 F (36.7 C)   SpO2: 98%   Weight: 81.6 kg (180 lb)   Height: 1.803 m (5\' 11" )      Physical Exam  Vitals reviewed.   Constitutional:       General: He is not in acute distress.     Appearance: Normal appearance. He is not ill-appearing, toxic-appearing or diaphoretic.      Comments: Overall very well-appearing in no acute distress.  Even nonlabored respirations. Speaks in full sentences.  Resting comfortably in stretcher.  Ambulatory.  Well-hydrated.  Nontoxic.  Pleasant and cooperative.   HENT:      Head: Normocephalic and atraumatic.      Right Ear: External ear normal.      Left Ear: External ear normal.      Nose: Nose normal.      Mouth/Throat:      Mouth: Mucous membranes are moist.   Eyes:      General: Lids are normal. Lids are everted, no foreign bodies appreciated. Vision grossly intact. Gaze aligned appropriately. No allergic shiner, visual field deficit or scleral icterus.        Right eye: No foreign body, discharge or hordeolum.         Left eye: No foreign body, discharge or hordeolum.      Extraocular Movements:      Right eye: Normal extraocular motion and no nystagmus.      Left eye: Normal extraocular motion and no nystagmus.      Conjunctiva/sclera:      Left eye: Left conjunctiva is not injected. Hemorrhage present. No chemosis or exudate.     Pupils: Pupils are equal, round, and reactive to light.      Left eye: No corneal abrasion or fluorescein uptake. Seidel exam negative.     Funduscopic exam:     Right eye: Red reflex present.         Left eye: No papilledema. Red reflex present.     Slit lamp exam:     Right eye: No hyphema or photophobia.      Left eye: No corneal ulcer, foreign body, hyphema or photophobia.      Visual Fields: Right eye visual fields normal and left eye visual fields normal.        Comments: Subconjunctival hemorrhage to the left eye in the temporal area.  No foreign bodies.  Full range of motion of EOMs.   No proptosis.  No enophthalmos.  No photosensitivity.  Posterior eye exam within normal limits without evidence of retinal detachment or papilledema.  No hyphema.  2 conjunctival abrasions in linear distribution in the area drawn above.  Negative Seidel  sign.   Pulmonary:      Effort: Pulmonary effort is normal. No respiratory distress.   Musculoskeletal:      Cervical back: Normal range of motion and neck supple.   Skin:     General: Skin is warm and dry.      Coloration: Skin is not jaundiced.   Neurological:      General: No focal deficit present.      Mental Status: He is alert. Mental status is at baseline.        Procedures     Procedures    Orders placed during this emergency department visit:     Orders Placed This Encounter   Procedures    Visual Acuity Screening    Please Bring Brooks Cao Lamp to Bedside        Medications given during this emergency department visit:     Medications   tetracaine  (TETRAVISC) 0.5 % ophthalmic solution 1 drop (1 drop Both Eyes Given 01/24/24 1631)   fluorescein ophthalmic strip 1 mg (1 mg Both Eyes Given 01/24/24 1631)       New prescriptions:     Discharge Medication List as of 01/24/2024  4:55 PM        START taking these medications    Details   erythromycin (ROMYCIN) 5 MG/GM ophthalmic ointment Apply to affected eye for 7 days four times a day, Disp-3.5 g, R-0, Print              Past History and Complexity:     History reviewed. No pertinent past medical history.     History reviewed. No pertinent surgical history.     Social History     Socioeconomic History    Marital status: Single     Spouse name: None    Number of children: None    Years of education: None    Highest education level: None   Tobacco Use    Smoking status: Never    Smokeless tobacco: Never   Substance and Sexual Activity    Alcohol use: Never    Drug use: Never     Social Drivers of Health     Social Connections: Unknown (11/21/2019)    Received from [facility], [facility]    Social Connections      Frequency of Communication with Friends and Family: Not asked     Frequency of Social Gatherings with Friends and Family: Not asked   Intimate Partner Violence: Unknown (11/21/2019)    Received from [facility], [facility]    Intimate Partner Violence     Fear of Current or Ex-Partner: Not asked     Emotionally Abused: Not asked     Physically Abused: Not asked     Sexually Abused: Not asked   Housing Stability: Not At Risk (11/10/2020)    Received from [facility], [facility]    Housing Stability     Was there a time when you did not have a steady place to sleep: Unrecognized value     Worried that the place you are staying is making you sick: Unrecognized value        Discharge Medication List as of 01/24/2024  4:55 PM        CONTINUE these medications which have NOT CHANGED    Details   ketorolac  (TORADOL ) 10 MG tablet Take 1 tablet by mouth every 6 hours as needed for Pain, Disp-20 tablet, R-0Normal  Results from this emergency department visit:      No results found for any visits on 01/24/24.      No orders to display                No results for input(s): "COVID19" in the last 72 hours.     Voice dictation software was used during the making of this note.  This software is not perfect and grammatical and other typographical errors may be present.  This note has not been completely proofread for errors.     Arron Large, [place]  01/24/24 Jerre Moots

## 2024-01-24 NOTE — ED Triage Notes (Signed)
 Patient arrived ambulatory to triage complaining of left eye injury. Patient states he had walked into a tree yesterday and lacerated his eye with a branch. Complains of blurry vision and pain to the left eye.
